# Patient Record
Sex: Male | Born: 1950 | Race: White | Hispanic: No | Marital: Single | Smoking: Current every day smoker
Health system: Southern US, Community
[De-identification: ages and names within clinical notes are randomized; demographics above are authoritative.]

## PROBLEM LIST (undated history)

## (undated) DIAGNOSIS — E785 Hyperlipidemia, unspecified: Secondary | ICD-10-CM

## (undated) DIAGNOSIS — I10 Essential (primary) hypertension: Secondary | ICD-10-CM

## (undated) HISTORY — PX: COLONOSCOPY: SHX174

---

## 2003-12-31 ENCOUNTER — Ambulatory Visit (HOSPITAL_COMMUNITY): Admission: RE | Admit: 2003-12-31 | Discharge: 2003-12-31 | Payer: Self-pay | Admitting: Family Medicine

## 2004-01-12 ENCOUNTER — Ambulatory Visit (HOSPITAL_COMMUNITY): Admission: RE | Admit: 2004-01-12 | Discharge: 2004-01-12 | Payer: Self-pay | Admitting: Family Medicine

## 2004-01-16 ENCOUNTER — Ambulatory Visit (HOSPITAL_COMMUNITY): Admission: RE | Admit: 2004-01-16 | Discharge: 2004-01-16 | Payer: Self-pay | Admitting: Internal Medicine

## 2005-11-08 IMAGING — CT CT CHEST W/ CM
1 of 2 series · 14 of 32 positions shown, 18 images · IV contrast (CONTRAST)
Comparison: none

CLINICAL DATA: Cough.  Smoker.    
 CT SCAN OF THE CHEST WITH INTRAVENOUS CONTRAST ? 01/12/2004
 Comparisons:  None.
TECHNIQUE: Multidetector, helical scanning through the chest was performed during infusion of 100 cc of Omnipaque 300, IV.  
 There is no enlarged hilar, mediastinal or axillary adenopathy.  No pleural or pericardial effusions.   The lungs are clear.  There is no focal infiltrate, pulmonary nodules or masses.
 IMPRESSION
 Negative CT scan of the chest.

[Series 2408: — · axial · 0.72mm/px · z∈[+1520,+1810]mm · 14 of 70 slices shown, 18 images]
[im 6/70  mediastinal]
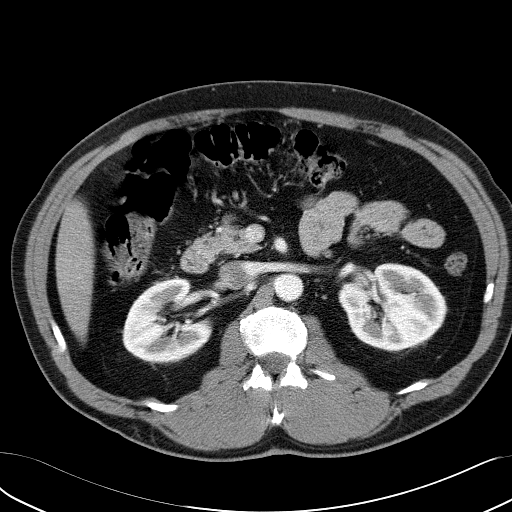
[im 6/70  lung]
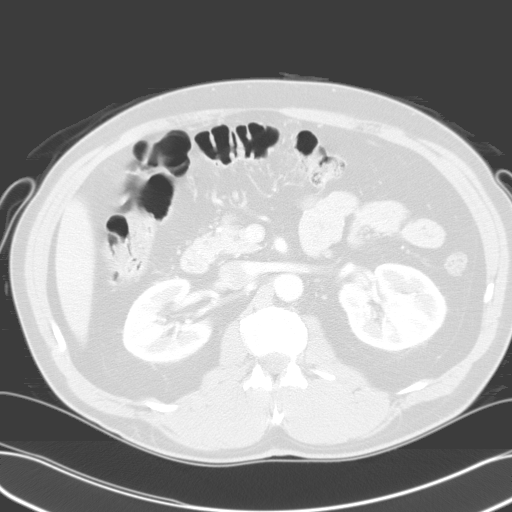
[im 11/70  lung]
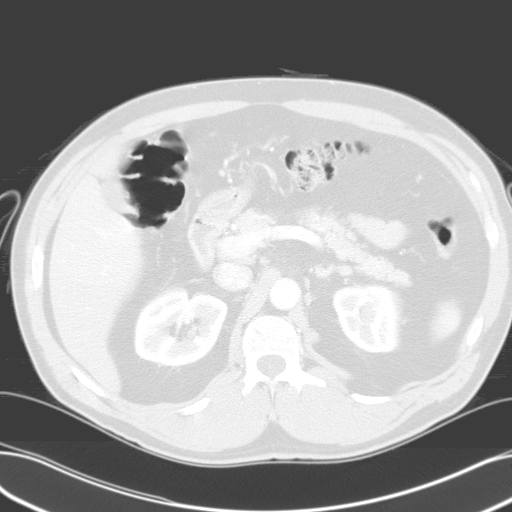
[im 16/70  lung]
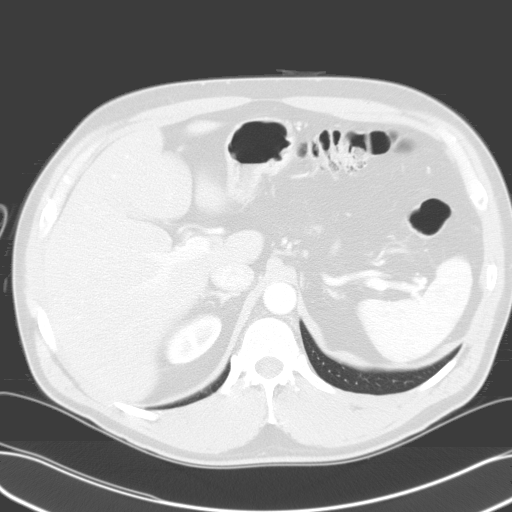
[im 22/70  lung]
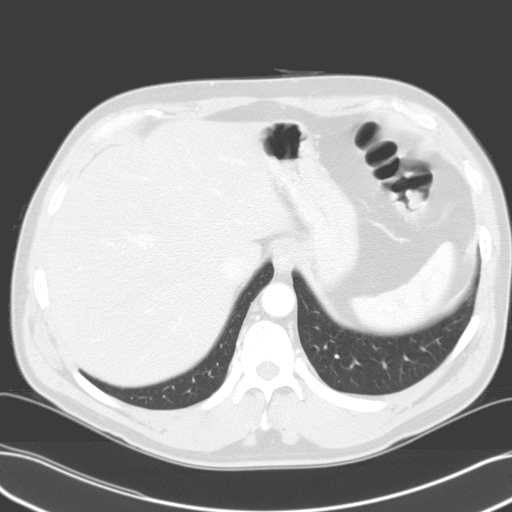
[im 27/70  mediastinal]
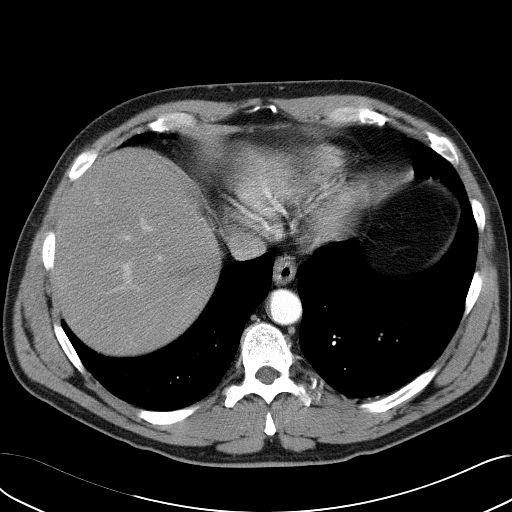
[im 27/70  lung]
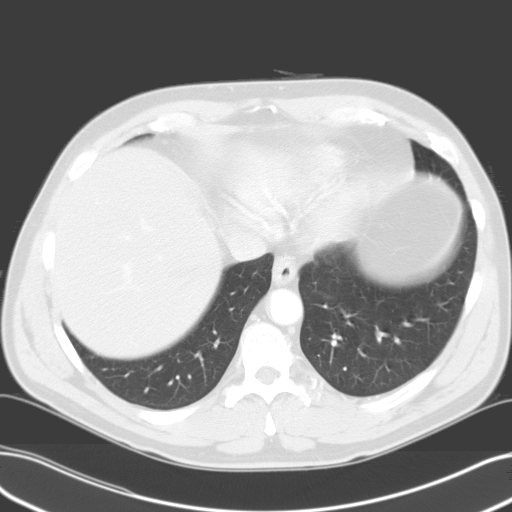
[im 32/70  lung]
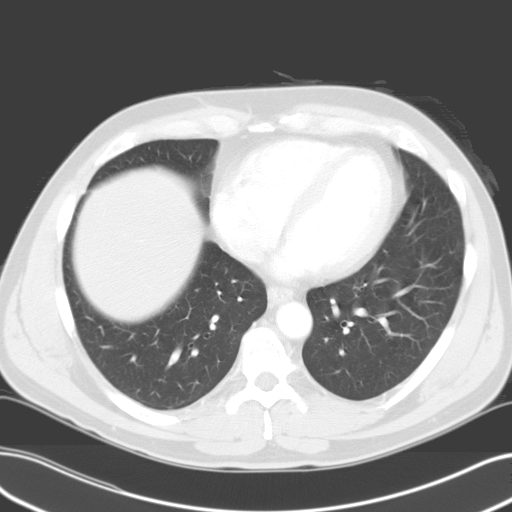
[im 34/70  lung]
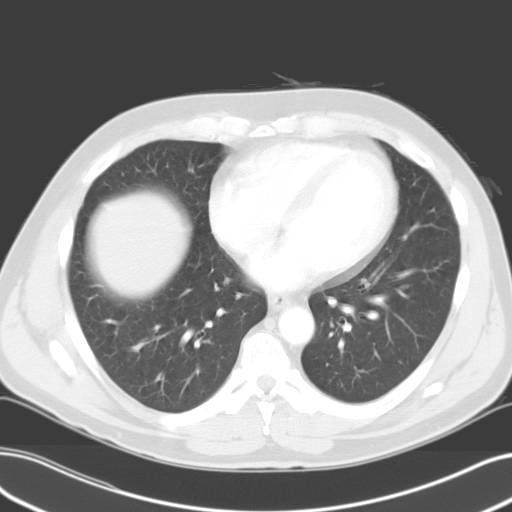
[im 35/70  lung]
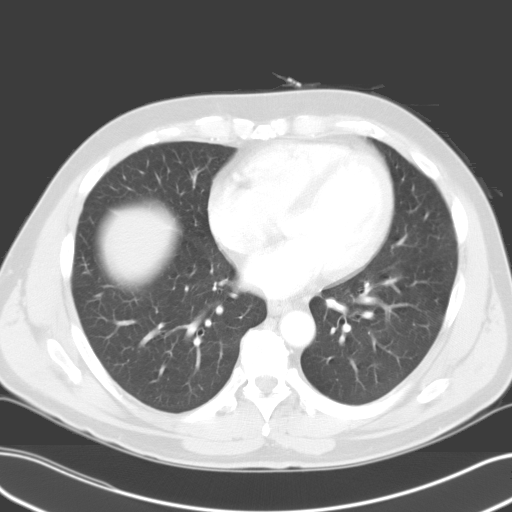
[im 38/70  mediastinal]
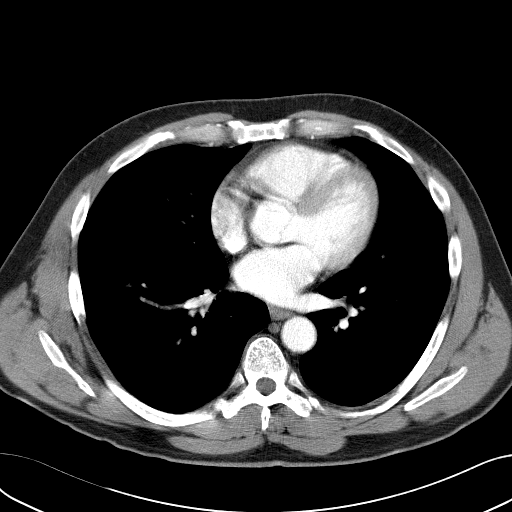
[im 38/70  lung]
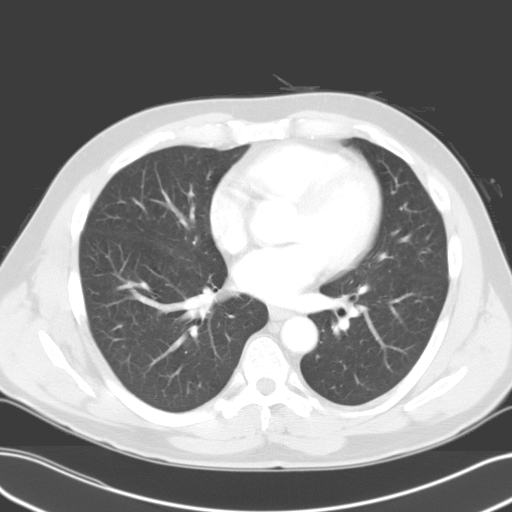
[im 43/70  lung]
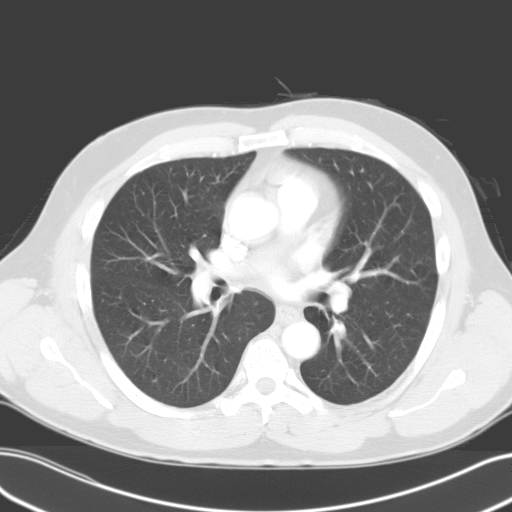
[im 48/70  lung]
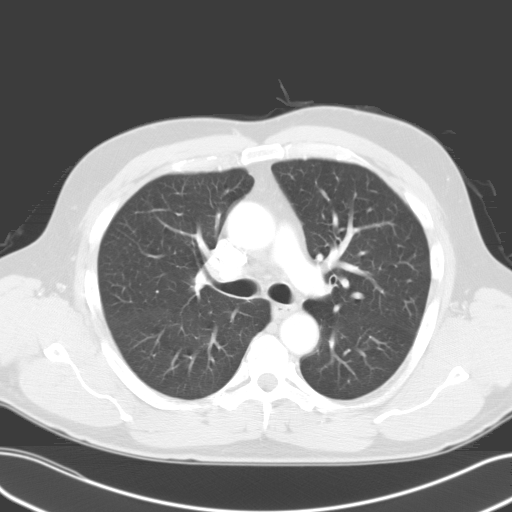
[im 54/70  lung]
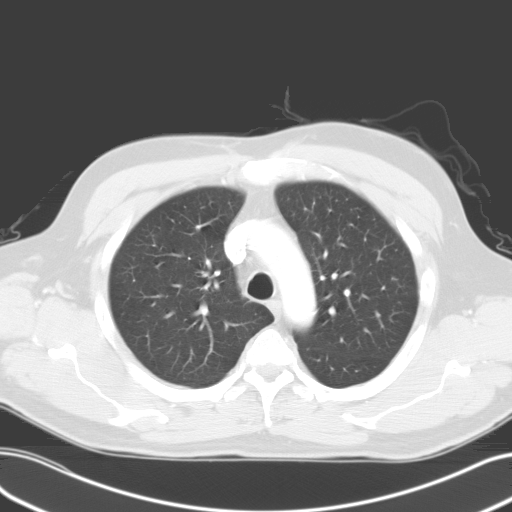
[im 59/70  mediastinal]
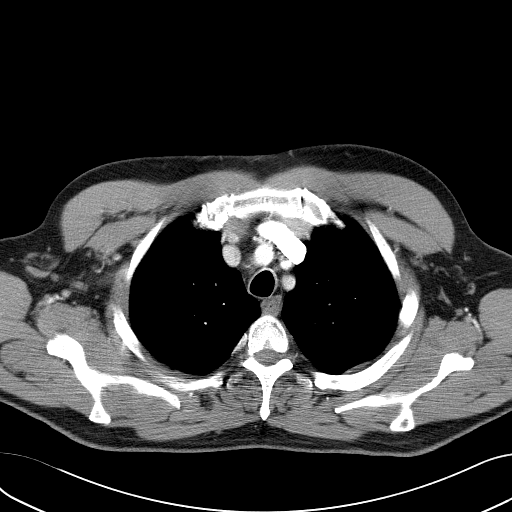
[im 59/70  lung]
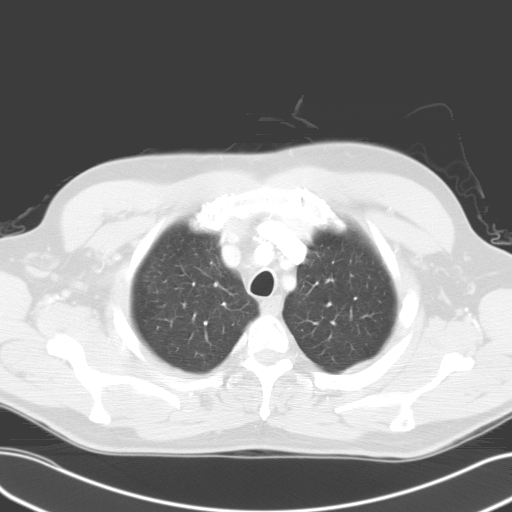
[im 64/70  lung]
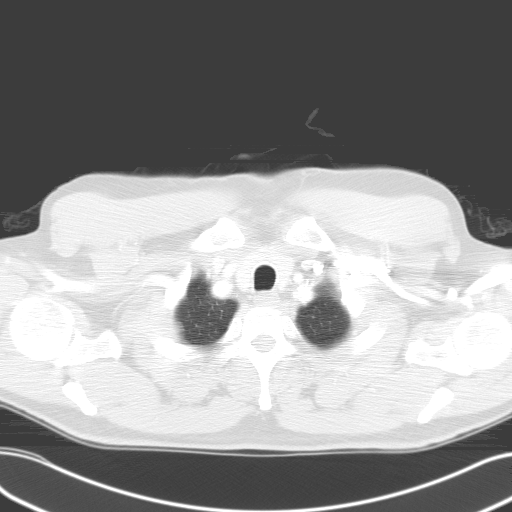

[14 of 32 positions shown; findings below may reference images not displayed]

## 2010-10-03 ENCOUNTER — Encounter: Payer: Self-pay | Admitting: Family Medicine

## 2013-04-29 ENCOUNTER — Encounter (HOSPITAL_COMMUNITY)
Admission: RE | Admit: 2013-04-29 | Discharge: 2013-04-29 | Disposition: A | Discharge status: Home / Self Care | Payer: BC Managed Care – PPO | Source: Ambulatory Visit | Attending: Ophthalmology | Admitting: Ophthalmology

## 2013-04-29 ENCOUNTER — Encounter (HOSPITAL_COMMUNITY): Payer: Self-pay

## 2013-04-29 DIAGNOSIS — Z01812 Encounter for preprocedural laboratory examination: Secondary | ICD-10-CM | POA: Insufficient documentation

## 2013-04-29 DIAGNOSIS — Z01810 Encounter for preprocedural cardiovascular examination: Secondary | ICD-10-CM | POA: Insufficient documentation

## 2013-04-29 HISTORY — DX: Hyperlipidemia, unspecified: E78.5

## 2013-04-29 HISTORY — DX: Essential (primary) hypertension: I10

## 2013-04-29 LAB — BASIC METABOLIC PANEL
BUN: 16 mg/dL (ref 6–23)
CO2: 24 mEq/L (ref 19–32)
Calcium: 9.5 mg/dL (ref 8.4–10.5)
GFR calc Af Amer: 73 mL/min — ABNORMAL LOW (ref >90)
GFR calc non Af Amer: 63 mL/min — ABNORMAL LOW (ref >90)
Glucose, Bld: 104 mg/dL — ABNORMAL HIGH (ref 70–99)
Sodium: 135 mEq/L (ref 135–145)

## 2013-04-29 LAB — HEMOGLOBIN AND HEMATOCRIT, BLOOD: Hemoglobin: 15.7 g/dL (ref 13.0–17.0)

## 2013-04-29 NOTE — Patient Instructions (Addendum)
Corey Ochoa  04/29/2013   Your procedure is scheduled on:  05/06/13  Report to [facility] at 1000 AM.  Call this number if you have problems the morning of surgery: [phone]   Remember:   Do not eat food or drink liquids after midnight.   Take these medicines the morning of surgery with A SIP OF WATER: enalapril   Do not wear jewelry, make-up or nail polish.  Do not wear lotions, powders, or perfumes. You may wear deodorant.  Do not shave 48 hours prior to surgery. Men may shave face and neck.  Do not bring valuables to the hospital.  [facility] is not responsible                   for any belongings or valuables.  Contacts, dentures or bridgework may not be worn into surgery.  Leave suitcase in the car. After surgery it may be brought to your room.  For patients admitted to the hospital, checkout time is 11:00 AM the day of  discharge.   Patients discharged the day of surgery will not be allowed to drive  home.  Name and phone number of your driver: family  Special Instructions: N/A   Please read over the following fact sheets that you were given: Anesthesia Post-op Instructions and Care and Recovery After Surgery   PATIENT INSTRUCTIONS POST-ANESTHESIA  IMMEDIATELY FOLLOWING SURGERY:  Do not drive or operate machinery for the first twenty four hours after surgery.  Do not make any important decisions for twenty four hours after surgery or while taking narcotic pain medications or sedatives.  If you develop intractable nausea and vomiting or a severe headache please notify your doctor immediately.  FOLLOW-UP:  Please make an appointment with your surgeon as instructed. You do not need to follow up with anesthesia unless specifically instructed to do so.  WOUND CARE INSTRUCTIONS (if applicable):  Keep a dry clean dressing on the anesthesia/puncture wound site if there is drainage.  Once the wound has quit draining you may leave it open to air.  Generally you should leave the  bandage intact for twenty four hours unless there is drainage.  If the epidural site drains for more than 36-48 hours please call the anesthesia department.  QUESTIONS?:  Please feel free to call your physician or the hospital operator if you have any questions, and they will be happy to assist you.      Cataract Surgery  A cataract is a clouding of the lens of the eye. When a lens becomes cloudy, vision is reduced based on the degree and nature of the clouding. Surgery may be needed to improve vision. Surgery removes the cloudy lens and usually replaces it with a substitute lens (intraocular lens, IOL). LET YOUR EYE DOCTOR KNOW ABOUT:  Allergies to food or medicine.  Medicines taken including herbs, eyedrops, over-the-counter medicines, and creams.  Use of steroids (by mouth or creams).  Previous problems with anesthetics or numbing medicine.  History of bleeding problems or blood clots.  Previous surgery.  Other health problems, including diabetes and kidney problems.  Possibility of pregnancy, if this applies. RISKS AND COMPLICATIONS  Infection.  Inflammation of the eyeball (endophthalmitis) that can spread to both eyes (sympathetic ophthalmia).  Poor wound healing.  If an IOL is inserted, it can later fall out of proper position. This is very uncommon.  Clouding of the part of your eye that holds an IOL in place. This is called an "after-cataract."  These are uncommon, but easily treated. BEFORE THE PROCEDURE  Do not eat or drink anything except small amounts of water for 8 to 12 before your surgery, or as directed by your caregiver.  Unless you are told otherwise, continue any eyedrops you have been prescribed.  Talk to your primary caregiver about all other medicines that you take (both prescription and non-prescription). In some cases, you may need to stop or change medicines near the time of your surgery. This is most important if you are taking blood-thinning  medicine.Do not stop medicines unless you are told to do so.  Arrange for someone to drive you to and from the procedure.  Do not put contact lenses in either eye on the day of your surgery. PROCEDURE There is more than one method for safely removing a cataract. Your doctor can explain the differences and help determine which is best for you. Phacoemulsification surgery is the most common form of cataract surgery.  An injection is given behind the eye or eyedrops are given to make this a painless procedure.  A small cut (incision) is made on the edge of the clear, dome-shaped surface that covers the front of the eye (cornea).  A tiny probe is painlessly inserted into the eye. This device gives off ultrasound waves that soften and break up the cloudy center of the lens. This makes it easier for the cloudy lens to be removed by suction.  An IOL may be implanted.  The normal lens of the eye is covered by a clear capsule. Part of that capsule is intentionally left in the eye to support the IOL.  Your surgeon may or may not use stitches to close the incision. There are other forms of cataract surgery that require a larger incision and stiches to close the eye. This approach is taken in cases where the doctor feels that the cataract cannot be easily removed using phacoemulsification. AFTER THE PROCEDURE  When an IOL is implanted, it does not need care. It becomes a permanent part of your eye and cannot be seen or felt.  Your doctor will schedule follow-up exams to check on your progress.  Review your other medicines with your doctor to see which can be resumed after surgery.  Use eyedrops or take medicine as prescribed by your doctor. Document Released: 08/18/2011 Document Revised: 11/21/2011 Document Reviewed: 08/18/2011 [facility] Patient Information 2014 [place], [place].

## 2013-05-03 MED ORDER — LIDOCAINE HCL 3.5 % OP GEL
OPHTHALMIC | Status: AC
  Filled 2013-05-03: qty 5

## 2013-05-03 MED ORDER — CYCLOPENTOLATE-PHENYLEPHRINE 0.2-1 % OP SOLN
OPHTHALMIC | Status: AC
  Filled 2013-05-03: qty 2

## 2013-05-03 MED ORDER — TETRACAINE HCL 0.5 % OP SOLN
OPHTHALMIC | Status: AC
  Filled 2013-05-03: qty 2

## 2013-05-03 MED ORDER — LIDOCAINE HCL (PF) 1 % IJ SOLN
INTRAMUSCULAR | Status: AC
  Filled 2013-05-03: qty 2

## 2013-05-03 MED ORDER — NEOMYCIN-POLYMYXIN-DEXAMETH 3.5-10000-0.1 OP OINT
TOPICAL_OINTMENT | OPHTHALMIC | Status: AC
  Filled 2013-05-03: qty 3.5

## 2013-05-06 ENCOUNTER — Ambulatory Visit (HOSPITAL_COMMUNITY)
Admission: RE | Admit: 2013-05-06 | Discharge: 2013-05-06 | Disposition: A | Discharge status: Home / Self Care | Payer: BC Managed Care – PPO | Source: Ambulatory Visit | Attending: Ophthalmology | Admitting: Ophthalmology

## 2013-05-06 ENCOUNTER — Encounter (HOSPITAL_COMMUNITY)
Admission: RE | Disposition: A | Discharge status: Home / Self Care | Payer: Self-pay | Source: Ambulatory Visit | Attending: Ophthalmology

## 2013-05-06 ENCOUNTER — Ambulatory Visit (HOSPITAL_COMMUNITY): Payer: BC Managed Care – PPO | Admitting: Anesthesiology

## 2013-05-06 ENCOUNTER — Encounter (HOSPITAL_COMMUNITY): Payer: Self-pay

## 2013-05-06 ENCOUNTER — Encounter (HOSPITAL_COMMUNITY): Payer: Self-pay | Admitting: Anesthesiology

## 2013-05-06 DIAGNOSIS — I10 Essential (primary) hypertension: Secondary | ICD-10-CM | POA: Insufficient documentation

## 2013-05-06 DIAGNOSIS — H269 Unspecified cataract: Secondary | ICD-10-CM | POA: Insufficient documentation

## 2013-05-06 DIAGNOSIS — F17200 Nicotine dependence, unspecified, uncomplicated: Secondary | ICD-10-CM | POA: Insufficient documentation

## 2013-05-06 HISTORY — PX: CATARACT EXTRACTION W/PHACO: SHX586

## 2013-05-06 SURGERY — PHACOEMULSIFICATION, CATARACT, WITH IOL INSERTION
Anesthesia: Monitor Anesthesia Care | Site: Eye | Laterality: Left | Wound class: Clean

## 2013-05-06 MED ORDER — EPINEPHRINE HCL 1 MG/ML IJ SOLN
INTRAOCULAR | Status: DC | PRN
  Administered 2013-05-06: 12:00:00

## 2013-05-06 MED ORDER — NEOMYCIN-POLYMYXIN-DEXAMETH 0.1 % OP OINT
TOPICAL_OINTMENT | OPHTHALMIC | Status: DC | PRN
  Administered 2013-05-06: 1 via OPHTHALMIC

## 2013-05-06 MED ORDER — LIDOCAINE HCL (PF) 1 % IJ SOLN
INTRAMUSCULAR | Status: DC | PRN
  Administered 2013-05-06: .4 mL

## 2013-05-06 MED ORDER — PROVISC 10 MG/ML IO SOLN
INTRAOCULAR | Status: DC | PRN
  Administered 2013-05-06: 8.5 mg via INTRAOCULAR

## 2013-05-06 MED ORDER — BSS IO SOLN
INTRAOCULAR | Status: DC | PRN
  Administered 2013-05-06: 15 mL via INTRAOCULAR

## 2013-05-06 MED ORDER — POVIDONE-IODINE 5 % OP SOLN
OPHTHALMIC | Status: DC | PRN
  Administered 2013-05-06: 1 via OPHTHALMIC

## 2013-05-06 MED ORDER — FENTANYL CITRATE 0.05 MG/ML IJ SOLN
INTRAMUSCULAR | Status: AC
  Filled 2013-05-06: qty 2

## 2013-05-06 MED ORDER — MIDAZOLAM HCL 2 MG/2ML IJ SOLN
1.0000 mg | INTRAMUSCULAR | Status: DC | PRN
  Administered 2013-05-06: 2 mg via INTRAVENOUS

## 2013-05-06 MED ORDER — MIDAZOLAM HCL 2 MG/2ML IJ SOLN
INTRAMUSCULAR | Status: AC
  Filled 2013-05-06: qty 2

## 2013-05-06 MED ORDER — FENTANYL CITRATE 0.05 MG/ML IJ SOLN
25.0000 ug | INTRAMUSCULAR | Status: AC
  Administered 2013-05-06: 25 ug via INTRAVENOUS

## 2013-05-06 MED ORDER — EPINEPHRINE HCL 1 MG/ML IJ SOLN
INTRAMUSCULAR | Status: AC
  Filled 2013-05-06: qty 1

## 2013-05-06 MED ORDER — PHENYLEPHRINE HCL 2.5 % OP SOLN
OPHTHALMIC | Status: AC
  Filled 2013-05-06: qty 2

## 2013-05-06 MED ORDER — PHENYLEPHRINE HCL 2.5 % OP SOLN
1.0000 [drp] | OPHTHALMIC | Status: AC
  Administered 2013-05-06 (×3): 1 [drp] via OPHTHALMIC

## 2013-05-06 MED ORDER — CYCLOPENTOLATE-PHENYLEPHRINE 0.2-1 % OP SOLN
1.0000 [drp] | OPHTHALMIC | Status: AC
  Administered 2013-05-06 (×3): 1 [drp] via OPHTHALMIC

## 2013-05-06 MED ORDER — LACTATED RINGERS IV SOLN
INTRAVENOUS | Status: DC
  Administered 2013-05-06: 11:00:00 via INTRAVENOUS

## 2013-05-06 MED ORDER — LIDOCAINE HCL 3.5 % OP GEL: 1.0000 "application " | Freq: Once | OPHTHALMIC | Status: DC

## 2013-05-06 MED ORDER — TETRACAINE HCL 0.5 % OP SOLN
1.0000 [drp] | OPHTHALMIC | Status: AC
  Administered 2013-05-06 (×3): 1 [drp] via OPHTHALMIC

## 2013-05-06 SURGICAL SUPPLY — 33 items
CAPSULAR TENSION RING-AMO (OPHTHALMIC RELATED) IMPLANT
CLOTH BEACON ORANGE TIMEOUT ST (SAFETY) ×2 IMPLANT
EYE SHIELD UNIVERSAL CLEAR (GAUZE/BANDAGES/DRESSINGS) ×2 IMPLANT
GLOVE BIO SURGEON STRL SZ 6.5 (GLOVE) IMPLANT
GLOVE BIOGEL PI IND STRL 6.5 (GLOVE) ×2 IMPLANT
GLOVE BIOGEL PI IND STRL 7.0 (GLOVE) ×1 IMPLANT
GLOVE BIOGEL PI IND STRL 7.5 (GLOVE) IMPLANT
GLOVE BIOGEL PI INDICATOR 6.5 (GLOVE) ×2
GLOVE BIOGEL PI INDICATOR 7.0 (GLOVE) ×1
GLOVE BIOGEL PI INDICATOR 7.5 (GLOVE)
GLOVE ECLIPSE 6.5 STRL STRAW (GLOVE) IMPLANT
GLOVE ECLIPSE 7.0 STRL STRAW (GLOVE) IMPLANT
GLOVE ECLIPSE 7.5 STRL STRAW (GLOVE) IMPLANT
GLOVE EXAM NITRILE LRG STRL (GLOVE) IMPLANT
GLOVE EXAM NITRILE MD LF STRL (GLOVE) IMPLANT
GLOVE SKINSENSE NS SZ6.5 (GLOVE)
GLOVE SKINSENSE NS SZ7.0 (GLOVE)
GLOVE SKINSENSE STRL SZ6.5 (GLOVE) IMPLANT
GLOVE SKINSENSE STRL SZ7.0 (GLOVE) IMPLANT
GOWN STRL REIN XL XLG (GOWN DISPOSABLE) ×2 IMPLANT
KIT VITRECTOMY (OPHTHALMIC RELATED) IMPLANT
PAD ARMBOARD 7.5X6 YLW CONV (MISCELLANEOUS) ×2 IMPLANT
PROC W NO LENS (INTRAOCULAR LENS)
PROC W SPEC LENS (INTRAOCULAR LENS)
PROCESS W NO LENS (INTRAOCULAR LENS) IMPLANT
PROCESS W SPEC LENS (INTRAOCULAR LENS) IMPLANT
RING MALYGIN (MISCELLANEOUS) IMPLANT
SIGHTPATH CAT PROC W REG LENS (Ophthalmic Related) ×2 IMPLANT
SYR TB 1ML LL NO SAFETY (SYRINGE) ×2 IMPLANT
TAPE SURG TRANSPORE 1 IN (GAUZE/BANDAGES/DRESSINGS) ×1 IMPLANT
TAPE SURGICAL TRANSPORE 1 IN (GAUZE/BANDAGES/DRESSINGS) ×1
VISCOELASTIC ADDITIONAL (OPHTHALMIC RELATED) IMPLANT
WATER STERILE IRR 250ML POUR (IV SOLUTION) ×2 IMPLANT

## 2013-05-06 NOTE — Transfer of Care (Signed)
Immediate Anesthesia Transfer of Care Note  Patient: Corey Ochoa  Procedure(s) Performed: Procedure(s) with comments: CATARACT EXTRACTION PHACO AND INTRAOCULAR LENS PLACEMENT (IOC) (Left) - CDE:14.70  Patient Location: Short Stay  Anesthesia Type:MAC  Level of Consciousness: awake, alert , oriented and patient cooperative  Airway & Oxygen Therapy: Patient Spontanous Breathing  Post-op Assessment: Report given to PACU RN and Post -op Vital signs reviewed and stable  Post vital signs: Reviewed and stable  Complications: No apparent anesthesia complications

## 2013-05-06 NOTE — H&P (Signed)
I have reviewed the H&P, the patient was re-examined, and I have identified no interval changes in medical condition and plan of care since the history and physical of record  

## 2013-05-06 NOTE — Anesthesia Postprocedure Evaluation (Signed)
  Anesthesia Post-op Note  Patient: Corey Ochoa  Procedure(s) Performed: Procedure(s) with comments: CATARACT EXTRACTION PHACO AND INTRAOCULAR LENS PLACEMENT (IOC) (Left) - CDE:14.70  Patient Location: Short Stay  Anesthesia Type:MAC  Level of Consciousness: awake, alert , oriented and patient cooperative  Airway and Oxygen Therapy: Patient Spontanous Breathing  Post-op Pain: none  Post-op Assessment: Post-op Vital signs reviewed, Patient's Cardiovascular Status Stable, Respiratory Function Stable, Patent Airway, No signs of Nausea or vomiting and Pain level controlled  Post-op Vital Signs: Reviewed and stable  Complications: No apparent anesthesia complications

## 2013-05-06 NOTE — Op Note (Signed)
Date of Admission: 05/06/2013  Date of Surgery: 05/06/2013  Pre-Op Dx: Cataract  Left  Eye  Post-Op Dx: Cataract  Left  Eye,  Dx Code 366.19  Surgeon: Gemma Payor, M.D.  Assistants: None  Anesthesia: Topical with MAC  Indications: Painless, progressive loss of vision with compromise of daily activities.  Surgery: Cataract Extraction with Intraocular lens Implant Left Eye  Discription: The patient had dilating drops and viscous lidocaine placed into the left eye in the pre-op holding area. After transfer to the operating room, a time out was performed. The patient was then prepped and draped. Beginning with a 75 degree blade a paracentesis port was made at the surgeon's 2 o'clock position. The anterior chamber was then filled with 1% non-preserved lidocaine. This was followed by filling the anterior chamber with Provisc. A bent cystatome needle was used to create a continuous tear capsulotomy. Hydrodissection was performed with balanced salt solution on a Fine canula. The lens nucleus was then removed using the phacoemulsification handpiece. Residual cortex was removed with the I&A handpiece. The anterior chamber and capsular bag were refilled with Provisc. A posterior chamber intraocular lens was placed into the capsular bag with it's injector. The implant was positioned with the Kuglan hook. The Provisc was then removed from the anterior chamber and capsular bag with the I&A handpiece. Stromal hydration of the main incision and paracentesis port was performed with BSS on a Fine canula. The wounds were tested for leak which was negative. The patient tolerated the procedure well. There were no operative complications. The patient was then transferred to the recovery room in stable condition.  Complications: None  Specimen: None  EBL: None  Prosthetic device: B&L enVista, MX60, power 20.0D, SN [id number].

## 2013-05-06 NOTE — Anesthesia Preprocedure Evaluation (Signed)
Anesthesia Evaluation  Patient identified by MRN, date of birth, ID band Patient awake    Reviewed: Allergy & Precautions, H&P , NPO status , Patient's Chart, lab work & pertinent test results  Airway Mallampati: II      Dental  (+) Teeth Intact   Pulmonary Current Smoker,  breath sounds clear to auscultation        Cardiovascular hypertension, Pt. on medications Rhythm:Regular Rate:Normal     Neuro/Psych    GI/Hepatic negative GI ROS,   Endo/Other    Renal/GU      Musculoskeletal   Abdominal   Peds  Hematology   Anesthesia Other Findings   Reproductive/Obstetrics                           Anesthesia Physical Anesthesia Plan  ASA: II  Anesthesia Plan: MAC   Post-op Pain Management:    Induction: Intravenous  Airway Management Planned: Nasal Cannula  Additional Equipment:   Intra-op Plan:   Post-operative Plan:   Informed Consent: I have reviewed the patients History and Physical, chart, labs and discussed the procedure including the risks, benefits and alternatives for the proposed anesthesia with the patient or authorized representative who has indicated his/her understanding and acceptance.     Plan Discussed with:   Anesthesia Plan Comments:         Anesthesia Quick Evaluation

## 2013-05-06 NOTE — Anesthesia Procedure Notes (Signed)
Procedure Name: MAC Date/Time: 05/06/2013 11:45 AM Performed by: Carolyne Littles, AMY L Pre-anesthesia Checklist: Patient identified, Timeout performed, Emergency Drugs available, Suction available and Patient being monitored Oxygen Delivery Method: Nasal cannula

## 2013-05-06 NOTE — Preoperative (Signed)
Beta Blockers   Reason not to administer Beta Blockers:Not Applicable 

## 2013-05-08 ENCOUNTER — Encounter (HOSPITAL_COMMUNITY): Payer: Self-pay | Admitting: Ophthalmology

## 2013-05-29 ENCOUNTER — Encounter (HOSPITAL_COMMUNITY)
Admission: RE | Admit: 2013-05-29 | Discharge: 2013-05-29 | Disposition: A | Discharge status: Home / Self Care | Payer: BC Managed Care – PPO | Source: Ambulatory Visit | Attending: Ophthalmology | Admitting: Ophthalmology

## 2013-05-29 ENCOUNTER — Encounter (HOSPITAL_COMMUNITY): Payer: Self-pay | Admitting: Pharmacy Technician

## 2013-05-29 ENCOUNTER — Encounter (HOSPITAL_COMMUNITY): Payer: Self-pay

## 2013-05-29 MED ORDER — ONDANSETRON HCL 4 MG/2ML IJ SOLN: 4.0000 mg | Freq: Once | INTRAMUSCULAR | Status: AC | PRN

## 2013-05-29 MED ORDER — FENTANYL CITRATE 0.05 MG/ML IJ SOLN
25.0000 ug | INTRAMUSCULAR | Status: DC | PRN
Start: 2013-05-29 — End: 2013-06-05

## 2013-05-31 MED ORDER — LIDOCAINE HCL (PF) 1 % IJ SOLN
INTRAMUSCULAR | Status: AC
  Filled 2013-05-31: qty 2

## 2013-05-31 MED ORDER — TETRACAINE HCL 0.5 % OP SOLN
OPHTHALMIC | Status: AC
  Filled 2013-05-31: qty 2

## 2013-05-31 MED ORDER — NEOMYCIN-POLYMYXIN-DEXAMETH 3.5-10000-0.1 OP OINT
TOPICAL_OINTMENT | OPHTHALMIC | Status: AC
  Filled 2013-05-31: qty 3.5

## 2013-05-31 MED ORDER — LIDOCAINE HCL 3.5 % OP GEL
OPHTHALMIC | Status: AC
  Filled 2013-05-31: qty 5

## 2013-05-31 MED ORDER — CYCLOPENTOLATE-PHENYLEPHRINE OP SOLN OPTIME - NO CHARGE
OPHTHALMIC | Status: AC
  Filled 2013-05-31: qty 2

## 2013-06-03 ENCOUNTER — Encounter (HOSPITAL_COMMUNITY)
Admission: RE | Disposition: A | Discharge status: Home / Self Care | Payer: Self-pay | Source: Ambulatory Visit | Attending: Ophthalmology

## 2013-06-03 ENCOUNTER — Encounter (HOSPITAL_COMMUNITY): Payer: Self-pay | Admitting: *Deleted

## 2013-06-03 ENCOUNTER — Encounter (HOSPITAL_COMMUNITY): Payer: Self-pay | Admitting: Anesthesiology

## 2013-06-03 ENCOUNTER — Ambulatory Visit (HOSPITAL_COMMUNITY)
Admission: RE | Admit: 2013-06-03 | Discharge: 2013-06-03 | Disposition: A | Discharge status: Home / Self Care | Payer: BC Managed Care – PPO | Source: Ambulatory Visit | Attending: Ophthalmology | Admitting: Ophthalmology

## 2013-06-03 ENCOUNTER — Ambulatory Visit (HOSPITAL_COMMUNITY): Payer: BC Managed Care – PPO | Admitting: Anesthesiology

## 2013-06-03 DIAGNOSIS — I10 Essential (primary) hypertension: Secondary | ICD-10-CM | POA: Insufficient documentation

## 2013-06-03 DIAGNOSIS — H2589 Other age-related cataract: Secondary | ICD-10-CM | POA: Insufficient documentation

## 2013-06-03 HISTORY — PX: CATARACT EXTRACTION W/PHACO: SHX586

## 2013-06-03 SURGERY — PHACOEMULSIFICATION, CATARACT, WITH IOL INSERTION
Anesthesia: Monitor Anesthesia Care | Site: Eye | Laterality: Right | Wound class: Clean

## 2013-06-03 MED ORDER — LIDOCAINE HCL 3.5 % OP GEL
1.0000 "application " | Freq: Once | OPHTHALMIC | Status: AC
  Administered 2013-06-03: 1 via OPHTHALMIC

## 2013-06-03 MED ORDER — PROVISC 10 MG/ML IO SOLN
INTRAOCULAR | Status: DC | PRN
  Administered 2013-06-03: 8.5 mg via INTRAOCULAR

## 2013-06-03 MED ORDER — TETRACAINE HCL 0.5 % OP SOLN
1.0000 [drp] | OPHTHALMIC | Status: AC
  Administered 2013-06-03 (×3): 1 [drp] via OPHTHALMIC

## 2013-06-03 MED ORDER — LIDOCAINE HCL (PF) 1 % IJ SOLN
INTRAMUSCULAR | Status: DC | PRN
  Administered 2013-06-03: .4 mL

## 2013-06-03 MED ORDER — LACTATED RINGERS IV SOLN
INTRAVENOUS | Status: DC | PRN
  Administered 2013-06-03: 08:00:00 via INTRAVENOUS

## 2013-06-03 MED ORDER — LIDOCAINE 3.5 % OP GEL OPTIME - NO CHARGE
OPHTHALMIC | Status: DC | PRN
  Administered 2013-06-03: 2 [drp] via OPHTHALMIC

## 2013-06-03 MED ORDER — POVIDONE-IODINE 5 % OP SOLN
OPHTHALMIC | Status: DC | PRN
  Administered 2013-06-03: 1 via OPHTHALMIC

## 2013-06-03 MED ORDER — MIDAZOLAM HCL 2 MG/2ML IJ SOLN
INTRAMUSCULAR | Status: AC
  Filled 2013-06-03: qty 2

## 2013-06-03 MED ORDER — BSS IO SOLN
INTRAOCULAR | Status: DC | PRN
  Administered 2013-06-03: 15 mL via INTRAOCULAR

## 2013-06-03 MED ORDER — LACTATED RINGERS IV SOLN
INTRAVENOUS | Status: DC
  Administered 2013-06-03: 1000 mL via INTRAVENOUS

## 2013-06-03 MED ORDER — NEOMYCIN-POLYMYXIN-DEXAMETH 0.1 % OP OINT
TOPICAL_OINTMENT | OPHTHALMIC | Status: DC | PRN
  Administered 2013-06-03: 1 via OPHTHALMIC

## 2013-06-03 MED ORDER — MIDAZOLAM HCL 2 MG/2ML IJ SOLN
1.0000 mg | INTRAMUSCULAR | Status: DC | PRN
  Administered 2013-06-03: 2 mg via INTRAVENOUS

## 2013-06-03 MED ORDER — EPINEPHRINE HCL 1 MG/ML IJ SOLN
INTRAMUSCULAR | Status: AC
  Filled 2013-06-03: qty 1

## 2013-06-03 MED ORDER — CYCLOPENTOLATE-PHENYLEPHRINE 0.2-1 % OP SOLN
1.0000 [drp] | OPHTHALMIC | Status: AC
  Administered 2013-06-03 (×3): 1 [drp] via OPHTHALMIC

## 2013-06-03 MED ORDER — FENTANYL CITRATE 0.05 MG/ML IJ SOLN
INTRAMUSCULAR | Status: AC
  Filled 2013-06-03: qty 2

## 2013-06-03 MED ORDER — PHENYLEPHRINE HCL 2.5 % OP SOLN
1.0000 [drp] | OPHTHALMIC | Status: AC
  Administered 2013-06-03 (×3): 1 [drp] via OPHTHALMIC

## 2013-06-03 MED ORDER — BSS IO SOLN
INTRAOCULAR | Status: DC | PRN
  Administered 2013-06-03: 08:00:00

## 2013-06-03 MED ORDER — FENTANYL CITRATE 0.05 MG/ML IJ SOLN
25.0000 ug | INTRAMUSCULAR | Status: AC
  Administered 2013-06-03 (×2): 25 ug via INTRAVENOUS

## 2013-06-03 SURGICAL SUPPLY — 32 items
CAPSULAR TENSION RING-AMO (OPHTHALMIC RELATED) IMPLANT
CLOTH BEACON ORANGE TIMEOUT ST (SAFETY) ×2 IMPLANT
EYE SHIELD UNIVERSAL CLEAR (GAUZE/BANDAGES/DRESSINGS) ×2 IMPLANT
GLOVE BIO SURGEON STRL SZ 6.5 (GLOVE) IMPLANT
GLOVE BIOGEL PI IND STRL 6.5 (GLOVE) ×1 IMPLANT
GLOVE BIOGEL PI IND STRL 7.0 (GLOVE) IMPLANT
GLOVE BIOGEL PI IND STRL 7.5 (GLOVE) IMPLANT
GLOVE BIOGEL PI INDICATOR 6.5 (GLOVE) ×1
GLOVE BIOGEL PI INDICATOR 7.0 (GLOVE)
GLOVE BIOGEL PI INDICATOR 7.5 (GLOVE)
GLOVE ECLIPSE 6.5 STRL STRAW (GLOVE) IMPLANT
GLOVE ECLIPSE 7.0 STRL STRAW (GLOVE) IMPLANT
GLOVE ECLIPSE 7.5 STRL STRAW (GLOVE) IMPLANT
GLOVE EXAM NITRILE LRG STRL (GLOVE) ×2 IMPLANT
GLOVE EXAM NITRILE MD LF STRL (GLOVE) IMPLANT
GLOVE SKINSENSE NS SZ6.5 (GLOVE)
GLOVE SKINSENSE NS SZ7.0 (GLOVE)
GLOVE SKINSENSE STRL SZ6.5 (GLOVE) IMPLANT
GLOVE SKINSENSE STRL SZ7.0 (GLOVE) IMPLANT
KIT VITRECTOMY (OPHTHALMIC RELATED) IMPLANT
PAD ARMBOARD 7.5X6 YLW CONV (MISCELLANEOUS) ×2 IMPLANT
PROC W NO LENS (INTRAOCULAR LENS)
PROC W SPEC LENS (INTRAOCULAR LENS)
PROCESS W NO LENS (INTRAOCULAR LENS) IMPLANT
PROCESS W SPEC LENS (INTRAOCULAR LENS) IMPLANT
RING MALYGIN (MISCELLANEOUS) IMPLANT
SIGHTPATH CAT PROC W REG LENS (Ophthalmic Related) ×2 IMPLANT
SYR TB 1ML LL NO SAFETY (SYRINGE) ×2 IMPLANT
TAPE SURG TRANSPORE 1 IN (GAUZE/BANDAGES/DRESSINGS) ×1 IMPLANT
TAPE SURGICAL TRANSPORE 1 IN (GAUZE/BANDAGES/DRESSINGS) ×1
VISCOELASTIC ADDITIONAL (OPHTHALMIC RELATED) IMPLANT
WATER STERILE IRR 250ML POUR (IV SOLUTION) ×2 IMPLANT

## 2013-06-03 NOTE — Preoperative (Signed)
Beta Blockers   Reason not to administer Beta Blockers:Not Applicable 

## 2013-06-03 NOTE — Anesthesia Preprocedure Evaluation (Signed)
Anesthesia Evaluation  Patient identified by MRN, date of birth, ID band Patient awake    Reviewed: Allergy & Precautions, H&P , NPO status , Patient's Chart, lab work & pertinent test results  Airway Mallampati: II      Dental  (+) Teeth Intact   Pulmonary Current Smoker,  breath sounds clear to auscultation        Cardiovascular hypertension, Pt. on medications Rhythm:Regular Rate:Normal     Neuro/Psych    GI/Hepatic negative GI ROS,   Endo/Other    Renal/GU      Musculoskeletal   Abdominal   Peds  Hematology   Anesthesia Other Findings   Reproductive/Obstetrics                           Anesthesia Physical Anesthesia Plan  ASA: II  Anesthesia Plan: MAC   Post-op Pain Management:    Induction: Intravenous  Airway Management Planned: Nasal Cannula  Additional Equipment:   Intra-op Plan:   Post-operative Plan:   Informed Consent: I have reviewed the patients History and Physical, chart, labs and discussed the procedure including the risks, benefits and alternatives for the proposed anesthesia with the patient or authorized representative who has indicated his/her understanding and acceptance.     Plan Discussed with:   Anesthesia Plan Comments:         Anesthesia Quick Evaluation  

## 2013-06-03 NOTE — Transfer of Care (Signed)
Immediate Anesthesia Transfer of Care Note  Patient: Corey Ochoa  Procedure(s) Performed: Procedure(s) with comments: CATARACT EXTRACTION PHACO AND INTRAOCULAR LENS PLACEMENT (IOC) (Right) - CDE:  15.82  Patient Location: Short Stay  Anesthesia Type:MAC  Level of Consciousness: awake, alert , oriented and patient cooperative  Airway & Oxygen Therapy: Patient Spontanous Breathing  Post-op Assessment: Report given to PACU RN and Post -op Vital signs reviewed and stable  Post vital signs: Reviewed and stable  Complications: No apparent anesthesia complications

## 2013-06-03 NOTE — Anesthesia Postprocedure Evaluation (Signed)
  Anesthesia Post-op Note  Patient: Corey Ochoa  Procedure(s) Performed: Procedure(s) with comments: CATARACT EXTRACTION PHACO AND INTRAOCULAR LENS PLACEMENT (IOC) (Right) - CDE:  15.82  Patient Location: Short Stay  Anesthesia Type:MAC  Level of Consciousness: awake, alert , oriented and patient cooperative  Airway and Oxygen Therapy: Patient Spontanous Breathing  Post-op Pain: none  Post-op Assessment: Post-op Vital signs reviewed, Patient's Cardiovascular Status Stable, Respiratory Function Stable, Patent Airway, No signs of Nausea or vomiting and Pain level controlled  Post-op Vital Signs: Reviewed and stable  Complications: No apparent anesthesia complications

## 2013-06-03 NOTE — Op Note (Signed)
Date of Admission: 06/03/2013  Date of Surgery: 06/03/2013  Pre-Op Dx: Cataract  Right  Eye  Post-Op Dx: Combined Cataract  Right  Eye,  Dx Code 366.19  Surgeon: Gemma Payor, M.D.  Assistants: None  Anesthesia: Topical with MAC  Indications: Painless, progressive loss of vision with compromise of daily activities.  Surgery: Cataract Extraction with Intraocular lens Implant Right Eye  Discription: The patient had dilating drops and viscous lidocaine placed into the left eye in the pre-op holding area. After transfer to the operating room, a time out was performed. The patient was then prepped and draped. Beginning with a 75 degree blade a paracentesis port was made at the surgeon's 2 o'clock position. The anterior chamber was then filled with 1% non-preserved lidocaine. This was followed by filling the anterior chamber with Provisc.  A 2.37mm keratome blade was used to make a clear corneal incision at the temporal limbus.  A bent cystatome needle was used to create a continuous tear capsulotomy. Hydrodissection was performed with balanced salt solution on a Fine canula. The lens nucleus was then removed using the phacoemulsification handpiece. Residual cortex was removed with the I&A handpiece. The anterior chamber and capsular bag were refilled with Provisc. A posterior chamber intraocular lens was placed into the capsular bag with it's injector. The implant was positioned with the Kuglan hook. The Provisc was then removed from the anterior chamber and capsular bag with the I&A handpiece. Stromal hydration of the main incision and paracentesis port was performed with BSS on a Fine canula. The wounds were tested for leak which was negative. The patient tolerated the procedure well. There were no operative complications. The patient was then transferred to the recovery room in stable condition.  Complications: None  Specimen: None  EBL: None  Prosthetic device: B&L enVista, MX60, power 21.0D, SN  [id number].

## 2013-06-03 NOTE — Anesthesia Procedure Notes (Signed)
Procedure Name: MAC Date/Time: 06/03/2013 8:25 AM Performed by: Carolyne Littles, AMY L Pre-anesthesia Checklist: Patient identified, Timeout performed, Emergency Drugs available, Suction available and Patient being monitored Oxygen Delivery Method: Nasal cannula

## 2013-06-03 NOTE — H&P (Signed)
I have reviewed the H&P, the patient was re-examined, and I have identified no interval changes in medical condition and plan of care since the history and physical of record  

## 2013-06-04 ENCOUNTER — Encounter (HOSPITAL_COMMUNITY): Payer: Self-pay | Admitting: Ophthalmology

## 2013-07-04 ENCOUNTER — Telehealth: Payer: Self-pay

## 2013-07-04 NOTE — Telephone Encounter (Signed)
Pt was referred by Dr. Sudie Bailey for screening colonoscopy. His last was 01/16/2004 with RMR and next to be in 10 years. He is not due until 2015 unless he is having GI problems and has had recent family hx of colon cancer. LMOM for a return call.

## 2013-07-04 NOTE — Telephone Encounter (Signed)
Pt returned call. York Spaniel he is not having any GI problems and no family hx of colon cancer. He will wait til 01/2014 to do next colonoscopy. Darl Pikes please nic.

## 2013-07-05 NOTE — Telephone Encounter (Signed)
Reminder in epic °

## 2013-12-31 ENCOUNTER — Telehealth: Payer: Self-pay

## 2013-12-31 ENCOUNTER — Other Ambulatory Visit: Payer: Self-pay

## 2013-12-31 DIAGNOSIS — Z1211 Encounter for screening for malignant neoplasm of colon: Secondary | ICD-10-CM

## 2014-01-08 ENCOUNTER — Encounter (HOSPITAL_COMMUNITY): Payer: Self-pay | Admitting: Pharmacy Technician

## 2014-01-13 MED ORDER — PEG-KCL-NACL-NASULF-NA ASC-C 100 G PO SOLR: 1.0000 | ORAL | Status: AC

## 2014-01-13 NOTE — Telephone Encounter (Signed)
Appropriate.

## 2014-01-13 NOTE — Telephone Encounter (Signed)
Gastroenterology Pre-Procedure Review  Request Date: 12/31/2013 Requesting Physician: Dr. Karie Kirks  PT'S LAST COLONOSCOPY WAS DONE BY DR. Gala Romney ON 01/16/2004  PATIENT REVIEW QUESTIONS: The patient responded to the following health history questions as indicated:    1. Diabetes Melitis: no 2. Joint replacements in the past 12 months: no 3. Major health problems in the past 3 months: no 4. Has an artificial valve or MVP: no 5. Has a defibrillator: no 6. Has been advised in past to take antibiotics in advance of a procedure like teeth cleaning: no    MEDICATIONS & ALLERGIES:    Patient reports the following regarding taking any blood thinners:   Plavix? no Aspirin? no Coumadin? no  Patient confirms/reports the following medications:  Current Outpatient Prescriptions  Medication Sig Dispense Refill  . Choline Fenofibrate (FENOFIBRIC ACID) 135 MG CPDR Take 135 mg by mouth daily.      . enalapril (VASOTEC) 20 MG tablet Take 20 mg by mouth daily.       No current facility-administered medications for this visit.    Patient confirms/reports the following allergies:  No Known Allergies  No orders of the defined types were placed in this encounter.    AUTHORIZATION INFORMATION Primary Insurance:   ID #:   Group #:  Pre-Cert / Auth required:  Pre-Cert / Auth #:   Secondary Insurance:   ID #:   Group #:  Pre-Cert / Auth required: Pre-Cert / Auth #:   SCHEDULE INFORMATION: Procedure has been scheduled as follows:  Date: 01/22/2014              Time: 8:30 AM  Location: [facility] Short Stay  This Gastroenterology Pre-Precedure Review Form is being routed to the following provider(s): R. Garfield Cornea, MD

## 2014-01-13 NOTE — Telephone Encounter (Signed)
Rx was sent to the pharmacy and instructions mailed to pt.  

## 2014-01-22 ENCOUNTER — Ambulatory Visit (HOSPITAL_COMMUNITY)
Admission: RE | Admit: 2014-01-22 | Discharge: 2014-01-22 | Disposition: A | Discharge status: Home / Self Care | Payer: BC Managed Care – PPO | Source: Ambulatory Visit | Attending: Internal Medicine | Admitting: Internal Medicine

## 2014-01-22 ENCOUNTER — Encounter (HOSPITAL_COMMUNITY)
Admission: RE | Disposition: A | Discharge status: Home / Self Care | Payer: Self-pay | Source: Ambulatory Visit | Attending: Internal Medicine

## 2014-01-22 ENCOUNTER — Encounter (HOSPITAL_COMMUNITY): Payer: Self-pay | Admitting: *Deleted

## 2014-01-22 DIAGNOSIS — Z1211 Encounter for screening for malignant neoplasm of colon: Secondary | ICD-10-CM | POA: Insufficient documentation

## 2014-01-22 DIAGNOSIS — Z79899 Other long term (current) drug therapy: Secondary | ICD-10-CM | POA: Insufficient documentation

## 2014-01-22 DIAGNOSIS — D128 Benign neoplasm of rectum: Secondary | ICD-10-CM | POA: Insufficient documentation

## 2014-01-22 DIAGNOSIS — K648 Other hemorrhoids: Secondary | ICD-10-CM | POA: Insufficient documentation

## 2014-01-22 DIAGNOSIS — F17200 Nicotine dependence, unspecified, uncomplicated: Secondary | ICD-10-CM | POA: Insufficient documentation

## 2014-01-22 DIAGNOSIS — D129 Benign neoplasm of anus and anal canal: Secondary | ICD-10-CM

## 2014-01-22 DIAGNOSIS — K644 Residual hemorrhoidal skin tags: Secondary | ICD-10-CM | POA: Insufficient documentation

## 2014-01-22 DIAGNOSIS — Z86010 Personal history of colonic polyps: Secondary | ICD-10-CM

## 2014-01-22 DIAGNOSIS — K5730 Diverticulosis of large intestine without perforation or abscess without bleeding: Secondary | ICD-10-CM | POA: Insufficient documentation

## 2014-01-22 DIAGNOSIS — D126 Benign neoplasm of colon, unspecified: Secondary | ICD-10-CM | POA: Insufficient documentation

## 2014-01-22 DIAGNOSIS — I10 Essential (primary) hypertension: Secondary | ICD-10-CM | POA: Insufficient documentation

## 2014-01-22 DIAGNOSIS — E785 Hyperlipidemia, unspecified: Secondary | ICD-10-CM | POA: Insufficient documentation

## 2014-01-22 HISTORY — PX: COLONOSCOPY: SHX5424

## 2014-01-22 SURGERY — COLONOSCOPY
Anesthesia: Moderate Sedation

## 2014-01-22 MED ORDER — ONDANSETRON HCL 4 MG/2ML IJ SOLN
INTRAMUSCULAR | Status: AC
  Filled 2014-01-22: qty 2

## 2014-01-22 MED ORDER — ONDANSETRON HCL 4 MG/2ML IJ SOLN
INTRAMUSCULAR | Status: DC | PRN
  Administered 2014-01-22: 4 mg via INTRAVENOUS

## 2014-01-22 MED ORDER — MEPERIDINE HCL 100 MG/ML IJ SOLN
INTRAMUSCULAR | Status: DC | PRN
  Administered 2014-01-22 (×2): 50 mg via INTRAVENOUS

## 2014-01-22 MED ORDER — STERILE WATER FOR IRRIGATION IR SOLN
Status: DC | PRN
  Administered 2014-01-22: 09:00:00

## 2014-01-22 MED ORDER — MIDAZOLAM HCL 5 MG/5ML IJ SOLN
INTRAMUSCULAR | Status: AC
  Filled 2014-01-22: qty 10

## 2014-01-22 MED ORDER — MEPERIDINE HCL 100 MG/ML IJ SOLN
INTRAMUSCULAR | Status: AC
  Filled 2014-01-22: qty 2

## 2014-01-22 MED ORDER — MIDAZOLAM HCL 5 MG/5ML IJ SOLN
INTRAMUSCULAR | Status: DC | PRN
  Administered 2014-01-22 (×2): 1 mg via INTRAVENOUS
  Administered 2014-01-22 (×2): 2 mg via INTRAVENOUS

## 2014-01-22 MED ORDER — SODIUM CHLORIDE 0.9 % IV SOLN
INTRAVENOUS | Status: DC
  Administered 2014-01-22: 08:00:00 via INTRAVENOUS

## 2014-01-22 NOTE — Op Note (Signed)
[facility] [address] [place], [address]   COLONOSCOPY PROCEDURE REPORT  PATIENT: Corey Ochoa, Corey Ochoa  MR#:         [id number] BIRTHDATE: [birth year] , 35  yrs. old GENDER: Male ENDOSCOPIST: R.  Garfield Cornea, MD FACP FACG REFERRED BY:  Lemmie Evens, M.D. PROCEDURE DATE:  01/22/2014 PROCEDURE:     Colonoscopy with snare polypectomy and polyp ablation  INDICATIONS: Average risk colorectal cancer screening examination  INFORMED CONSENT:  The risks, benefits, alternatives and imponderables including but not limited to bleeding, perforation as well as the possibility of a missed lesion have been reviewed.  The potential for biopsy, lesion removal, etc. have also been discussed.  Questions have been answered.  All parties agreeable. Please see the history and physical in the medical record for more information.  MEDICATIONS: Versed 6 mg IV and Demerol 100 mg IV in divided doses. Zofran 4 mg IV.  DESCRIPTION OF PROCEDURE:  After a digital rectal exam was performed, the EC-[id number] ([id number])  colonoscope was advanced from the anus through the rectum and colon to the area of the cecum, ileocecal valve and appendiceal orifice.  The cecum was deeply intubated.  These structures were well-seen and photographed for the record.  From the level of the cecum and ileocecal valve, the scope was slowly and cautiously withdrawn.  The mucosal surfaces were carefully surveyed utilizing scope tip deflection to facilitate fold flattening as needed.  The scope was pulled down into the rectum where a thorough examination including retroflexion was performed.    FINDINGS:  Adequate preparation. External and internal hemorrhoids; rectal mucosa normal aside from (1) diminutive rectosigmoid polyp. The patient had left-sided diverticulosis in the mid sigmoid segment there was (1) 9 mm angry pedunculated polyp and (1) adjacent diminutive polyp; otherwise, the remainder of the  colonic mucosa appeared normal.  THERAPEUTIC / DIAGNOSTIC MANEUVERS PERFORMED:  The above mentioned diminutive polyps were ablated with the tip of the hot snare loop. The large sigmoid polyp was removed with snare cautery.  COMPLICATIONS: None  CECAL WITHDRAWAL TIME:  10 minutes  IMPRESSION:  Hemorrhoids. Multiple rectal and colonic polyps treated/removed as described above.  RECOMMENDATIONS: Followup on pathology.   _______________________________ eSigned:  R. Garfield Cornea, MD FACP [facility] 01/22/2014 9:34 AM   CC:    PATIENT NAME:  [name], [name] MR#: [id number]

## 2014-01-22 NOTE — H&P (Signed)
l °

## 2014-01-22 NOTE — H&P (Signed)
_0 @   Primary Care Physician:  Robert Bellow, MD Primary Gastroenterologist:  Dr. Gala Romney  Pre-Procedure History & Physical: HPI:  Corey Ochoa is a 63 y.o. male is here for a screening colonoscopy. Negative colonoscopy 10 years ago. No bowel symptoms. No family history of colon cancer or polyps.  Past Medical History  Diagnosis Date  . Hypertension   . Hyperlipidemia     Past Surgical History  Procedure Laterality Date  . Colonoscopy    . Cataract extraction w/phaco Left 05/06/2013    Procedure: CATARACT EXTRACTION PHACO AND INTRAOCULAR LENS PLACEMENT (IOC);  Surgeon: Tonny Branch, MD;  Location: [facility];  Service: Ophthalmology;  Laterality: Left;  CDE:14.70  . Cataract extraction w/phaco Right 06/03/2013    Procedure: CATARACT EXTRACTION PHACO AND INTRAOCULAR LENS PLACEMENT (IOC);  Surgeon: Tonny Branch, MD;  Location: [facility];  Service: Ophthalmology;  Laterality: Right;  CDE:  15.82    Prior to Admission medications   Medication Sig Start Date End Date Taking? Authorizing Provider  Choline Fenofibrate (FENOFIBRIC ACID) 135 MG CPDR Take 135 mg by mouth daily.   Yes Historical Provider, MD  enalapril (VASOTEC) 20 MG tablet Take 20 mg by mouth daily.   Yes Historical Provider, MD  peg 3350 powder (MOVIPREP) 100 G SOLR Take 1 kit (200 g total) by mouth as directed. 01/13/14  Yes Daneil Dolin, MD    Allergies as of 12/31/2013  . (No Known Allergies)    Family History  Problem Relation Age of Onset  . Colon cancer Neg Hx     History   Social History  . Marital Status: Single    Spouse Name: N/A    Number of Children: N/A  . Years of Education: N/A   Occupational History  . Not on file.   Social History Main Topics  . Smoking status: Current Every Day Smoker -- 1.00 packs/day for 45 years    Types: Cigarettes  . Smokeless tobacco: Not on file  . Alcohol Use: Yes     Comment: occasional  . Drug Use: No  . Sexual Activity: Not on file   Other Topics Concern   . Not on file   Social History Narrative  . No narrative on file    Review of Systems: See HPI, otherwise negative ROS  Physical Exam: BP 150/90  Pulse 84  Temp(Src) 97.6 F (36.4 C) (Oral)  Resp 18  Ht _1  (1.753 m)  Wt 180 lb (81.647 kg)  BMI 26.57 kg/m2  SpO2 98% General:   Alert,  Well-developed, well-nourished, pleasant and cooperative in NAD Head:  Normocephalic and atraumatic. Eyes:  Sclera clear, no icterus.   Conjunctiva pink. Ears:  Normal auditory acuity. Nose:  No deformity, discharge,  or lesions. Mouth:  No deformity or lesions, dentition normal. Neck:  Supple; no masses or thyromegaly. Lungs:  Clear throughout to auscultation.   No wheezes, crackles, or rhonchi. No acute distress. Heart:  Regular rate and rhythm; no murmurs, clicks, rubs,  or gallops. Abdomen:  Soft, nontender and nondistended. No masses, hepatosplenomegaly or hernias noted. Normal bowel sounds, without guarding, and without rebound.   Msk:  Symmetrical without gross deformities. Normal posture. Pulses:  Normal pulses noted. Extremities:  Without clubbing or edema. Neurologic:  Alert and  oriented x4;  grossly normal neurologically.  Impression/Plan: Corey Ochoa is now here to undergo a screening colonoscopy.  Average risk screening examination.  Risks, benefits, limitations, imponderables and alternatives regarding colonoscopy have been reviewed with  the patient. Questions have been answered. All parties agreeable.     Notice:  This dictation was prepared with Dragon dictation along with smaller phrase technology. Any transcriptional errors that result from this process are unintentional and may not be corrected upon review. 

## 2014-01-22 NOTE — Discharge Instructions (Addendum)
Colonoscopy Discharge Instructions  Read the instructions outlined below and refer to this sheet in the next few weeks. These discharge instructions provide you with general information on caring for yourself after you leave the hospital. Your doctor may also give you specific instructions. While your treatment has been planned according to the most current medical practices available, unavoidable complications occasionally occur. If you have any problems or questions after discharge, call Dr. Gala Romney at [phone]. ACTIVITY  You may resume your regular activity, but move at a slower pace for the next 24 hours.   Take frequent rest periods for the next 24 hours.   Walking will help get rid of the air and reduce the bloated feeling in your belly (abdomen).   No driving for 24 hours (because of the medicine (anesthesia) used during the test).    Do not sign any important legal documents or operate any machinery for 24 hours (because of the anesthesia used during the test).  NUTRITION  Drink plenty of fluids.   You may resume your normal diet as instructed by your doctor.   Begin with a light meal and progress to your normal diet. Heavy or fried foods are harder to digest and may make you feel sick to your stomach (nauseated).   Avoid alcoholic beverages for 24 hours or as instructed.  MEDICATIONS  You may resume your normal medications unless your doctor tells you otherwise.  WHAT YOU CAN EXPECT TODAY  Some feelings of bloating in the abdomen.   Passage of more gas than usual.   Spotting of blood in your stool or on the toilet paper.  IF YOU HAD POLYPS REMOVED DURING THE COLONOSCOPY:  No aspirin products for 7 days or as instructed.   No alcohol for 7 days or as instructed.   Eat a soft diet for the next 24 hours.  FINDING OUT THE RESULTS OF YOUR TEST Not all test results are available during your visit. If your test results are not back during the visit, make an appointment  with your caregiver to find out the results. Do not assume everything is normal if you have not heard from your caregiver or the medical facility. It is important for you to follow up on all of your test results.  SEEK IMMEDIATE MEDICAL ATTENTION IF:  You have more than a spotting of blood in your stool.   Your belly is swollen (abdominal distention).   You are nauseated or vomiting.   You have a temperature over 101.   You have abdominal pain or discomfort that is severe or gets worse throughout the day.    Diverticulosis Diverticulosis is a common condition that develops when small pouches (diverticula) form in the wall of the colon. The risk of diverticulosis increases with age. It happens more often in people who eat a low-fiber diet. Most individuals with diverticulosis have no symptoms. Those individuals with symptoms usually experience abdominal pain, constipation, or loose stools (diarrhea). HOME CARE INSTRUCTIONS   Increase the amount of fiber in your diet as directed by your caregiver or dietician. This may reduce symptoms of diverticulosis.  Your caregiver may recommend taking a dietary fiber supplement.  Drink at least 6 to 8 glasses of water each day to prevent constipation.  Try not to strain when you have a bowel movement.  Your caregiver may recommend avoiding nuts and seeds to prevent complications, although this is still an uncertain benefit.  Only take over-the-counter or prescription medicines for pain, discomfort, or fever  as directed by your caregiver. FOODS WITH HIGH FIBER CONTENT INCLUDE:  Fruits. Apple, peach, pear, tangerine, raisins, prunes.  Vegetables. Brussels sprouts, asparagus, broccoli, cabbage, carrot, cauliflower, romaine lettuce, spinach, summer squash, tomato, winter squash, zucchini.  Starchy Vegetables. Baked beans, kidney beans, lima beans, split peas, lentils, potatoes (with skin).  Grains. Whole wheat bread, brown rice, bran flake cereal,  plain oatmeal, white rice, shredded wheat, bran muffins. SEEK IMMEDIATE MEDICAL CARE IF:   You develop increasing pain or severe bloating.  You have an oral temperature above 102 F (38.9 C), not controlled by medicine.  You develop vomiting or bowel movements that are bloody or black. Document Released: 05/26/2004 Document Revised: 11/21/2011 Document Reviewed: 01/27/2010 [facility] Patient Information 2014 [place].    Colon Polyps Polyps are lumps of extra tissue growing inside the body. Polyps can grow in the large intestine (colon). Most colon polyps are noncancerous (benign). However, some colon polyps can become cancerous over time. Polyps that are larger than a pea may be harmful. To be safe, caregivers remove and test all polyps. CAUSES  Polyps form when mutations in the genes cause your cells to grow and divide even though no more tissue is needed. RISK FACTORS There are a number of risk factors that can increase your chances of getting colon polyps. They include:  Being older than 50 years.  Family history of colon polyps or colon cancer.  Long-term colon diseases, such as colitis or Crohn disease.  Being overweight.  Smoking.  Being inactive.  Drinking too much alcohol. SYMPTOMS  Most small polyps do not cause symptoms. If symptoms are present, they may include:  Blood in the stool. The stool may look dark red or black.  Constipation or diarrhea that lasts longer than 1 week. DIAGNOSIS People often do not know they have polyps until their caregiver finds them during a regular checkup. Your caregiver can use 4 tests to check for polyps:  Digital rectal exam. The caregiver wears gloves and feels inside the rectum. This test would find polyps only in the rectum.  Barium enema. The caregiver puts a liquid called barium into your rectum before taking X-rays of your colon. Barium makes your colon look white. Polyps are dark, so they are easy to see in the X-ray  pictures.  Sigmoidoscopy. A thin, flexible tube (sigmoidoscope) is placed into your rectum. The sigmoidoscope has a light and tiny camera in it. The caregiver uses the sigmoidoscope to look at the last third of your colon.  Colonoscopy. This test is like sigmoidoscopy, but the caregiver looks at the entire colon. This is the most common method for finding and removing polyps. TREATMENT  Any polyps will be removed during a sigmoidoscopy or colonoscopy. The polyps are then tested for cancer. PREVENTION  To help lower your risk of getting more colon polyps:  Eat plenty of fruits and vegetables. Avoid eating fatty foods.  Do not smoke.  Avoid drinking alcohol.  Exercise every day.  Lose weight if recommended by your caregiver.  Eat plenty of calcium and folate. Foods that are rich in calcium include milk, cheese, and broccoli. Foods that are rich in folate include chickpeas, kidney beans, and spinach. HOME CARE INSTRUCTIONS Keep all follow-up appointments as directed by your caregiver. You may need periodic exams to check for polyps. SEEK MEDICAL CARE IF: You notice bleeding during a bowel movement. Document Released: 05/25/2004 Document Revised: 11/21/2011 Document Reviewed: 11/08/2011 [facility] Patient Information 2014 [place].    Diverticulosis and polyp  information provided. You had polyps which were treated/removed. You also diverticulosis.  Further recommendations to follow pending review of pathology report

## 2014-01-24 ENCOUNTER — Encounter (HOSPITAL_COMMUNITY): Payer: Self-pay | Admitting: Internal Medicine

## 2014-01-24 ENCOUNTER — Encounter: Payer: Self-pay | Admitting: Internal Medicine

## 2019-01-22 ENCOUNTER — Encounter: Payer: Self-pay | Admitting: Internal Medicine
